# Patient Record
Sex: Female | Born: 1970 | Race: Black or African American | Hispanic: No | Marital: Single | Smoking: Never smoker
Health system: Southern US, Community
[De-identification: ages and names within clinical notes are randomized; demographics above are authoritative.]

## PROBLEM LIST (undated history)

## (undated) DIAGNOSIS — I10 Essential (primary) hypertension: Secondary | ICD-10-CM

## (undated) DIAGNOSIS — J45909 Unspecified asthma, uncomplicated: Secondary | ICD-10-CM

## (undated) DIAGNOSIS — E119 Type 2 diabetes mellitus without complications: Secondary | ICD-10-CM

## (undated) DIAGNOSIS — G8929 Other chronic pain: Secondary | ICD-10-CM

## (undated) DIAGNOSIS — K760 Fatty (change of) liver, not elsewhere classified: Secondary | ICD-10-CM

## (undated) DIAGNOSIS — R109 Unspecified abdominal pain: Secondary | ICD-10-CM

## (undated) HISTORY — PX: ABDOMINAL HYSTERECTOMY: SHX81

---

## 2014-06-15 ENCOUNTER — Emergency Department (HOSPITAL_COMMUNITY): Payer: No Typology Code available for payment source

## 2014-06-15 ENCOUNTER — Encounter (HOSPITAL_COMMUNITY): Payer: Self-pay | Admitting: *Deleted

## 2014-06-15 ENCOUNTER — Emergency Department (HOSPITAL_COMMUNITY)
Admission: EM | Admit: 2014-06-15 | Discharge: 2014-06-15 | Disposition: A | Discharge status: Home / Self Care | Payer: No Typology Code available for payment source | Attending: Emergency Medicine | Admitting: Emergency Medicine

## 2014-06-15 DIAGNOSIS — R935 Abnormal findings on diagnostic imaging of other abdominal regions, including retroperitoneum: Secondary | ICD-10-CM | POA: Diagnosis not present

## 2014-06-15 DIAGNOSIS — N390 Urinary tract infection, site not specified: Secondary | ICD-10-CM | POA: Insufficient documentation

## 2014-06-15 DIAGNOSIS — R109 Unspecified abdominal pain: Secondary | ICD-10-CM | POA: Insufficient documentation

## 2014-06-15 DIAGNOSIS — Z90710 Acquired absence of both cervix and uterus: Secondary | ICD-10-CM | POA: Diagnosis not present

## 2014-06-15 DIAGNOSIS — I10 Essential (primary) hypertension: Secondary | ICD-10-CM | POA: Diagnosis not present

## 2014-06-15 DIAGNOSIS — Z8719 Personal history of other diseases of the digestive system: Secondary | ICD-10-CM | POA: Insufficient documentation

## 2014-06-15 DIAGNOSIS — R9389 Abnormal findings on diagnostic imaging of other specified body structures: Secondary | ICD-10-CM

## 2014-06-15 DIAGNOSIS — G8929 Other chronic pain: Secondary | ICD-10-CM | POA: Diagnosis not present

## 2014-06-15 DIAGNOSIS — J45909 Unspecified asthma, uncomplicated: Secondary | ICD-10-CM | POA: Diagnosis not present

## 2014-06-15 DIAGNOSIS — E119 Type 2 diabetes mellitus without complications: Secondary | ICD-10-CM | POA: Diagnosis not present

## 2014-06-15 DIAGNOSIS — R319 Hematuria, unspecified: Secondary | ICD-10-CM

## 2014-06-15 DIAGNOSIS — Z79899 Other long term (current) drug therapy: Secondary | ICD-10-CM | POA: Insufficient documentation

## 2014-06-15 HISTORY — DX: Essential (primary) hypertension: I10

## 2014-06-15 HISTORY — DX: Other chronic pain: G89.29

## 2014-06-15 HISTORY — DX: Type 2 diabetes mellitus without complications: E11.9

## 2014-06-15 HISTORY — DX: Fatty (change of) liver, not elsewhere classified: K76.0

## 2014-06-15 HISTORY — DX: Unspecified abdominal pain: R10.9

## 2014-06-15 HISTORY — DX: Unspecified asthma, uncomplicated: J45.909

## 2014-06-15 LAB — CBC WITH DIFFERENTIAL/PLATELET
BASOS PCT: 0 % (ref 0–1)
Basophils Absolute: 0 10*3/uL (ref 0.0–0.1)
EOS ABS: 0 10*3/uL (ref 0.0–0.7)
Eosinophils Relative: 0 % (ref 0–5)
HEMATOCRIT: 43.3 % (ref 36.0–46.0)
HEMOGLOBIN: 14.7 g/dL (ref 12.0–15.0)
Lymphocytes Relative: 11 % — ABNORMAL LOW (ref 12–46)
Lymphs Abs: 0.8 10*3/uL (ref 0.7–4.0)
MCH: 26.8 pg (ref 26.0–34.0)
MCHC: 33.9 g/dL (ref 30.0–36.0)
MCV: 79 fL (ref 78.0–100.0)
MONO ABS: 0.3 10*3/uL (ref 0.1–1.0)
MONOS PCT: 4 % (ref 3–12)
NEUTROS ABS: 6.2 10*3/uL (ref 1.7–7.7)
Neutrophils Relative %: 85 % — ABNORMAL HIGH (ref 43–77)
Platelets: 369 10*3/uL (ref 150–400)
RBC: 5.48 MIL/uL — ABNORMAL HIGH (ref 3.87–5.11)
RDW: 14.1 % (ref 11.5–15.5)
WBC: 7.4 10*3/uL (ref 4.0–10.5)

## 2014-06-15 LAB — URINALYSIS, ROUTINE W REFLEX MICROSCOPIC
BILIRUBIN URINE: NEGATIVE
Glucose, UA: >1000 mg/dL — AB
Ketones, ur: 40 mg/dL — AB
LEUKOCYTES UA: NEGATIVE
Nitrite: NEGATIVE
PH: 5.5 (ref 5.0–8.0)
Protein, ur: NEGATIVE mg/dL
SPECIFIC GRAVITY, URINE: 1.02 (ref 1.005–1.030)
Urobilinogen, UA: 0.2 mg/dL (ref 0.0–1.0)

## 2014-06-15 LAB — URINE MICROSCOPIC-ADD ON

## 2014-06-15 LAB — COMPREHENSIVE METABOLIC PANEL
ALBUMIN: 3.6 g/dL (ref 3.5–5.2)
ALK PHOS: 105 U/L (ref 39–117)
ALT: 59 U/L — AB (ref 0–35)
AST: 43 U/L — ABNORMAL HIGH (ref 0–37)
Anion gap: 13 (ref 5–15)
BUN: 8 mg/dL (ref 6–23)
CO2: 24 mEq/L (ref 19–32)
Calcium: 8.8 mg/dL (ref 8.4–10.5)
Chloride: 97 mEq/L (ref 96–112)
Creatinine, Ser: 0.79 mg/dL (ref 0.50–1.10)
GFR calc Af Amer: >90 mL/min (ref >90)
GFR calc non Af Amer: >90 mL/min (ref >90)
Glucose, Bld: 332 mg/dL — ABNORMAL HIGH (ref 70–99)
POTASSIUM: 3.9 meq/L (ref 3.7–5.3)
SODIUM: 134 meq/L — AB (ref 137–147)
TOTAL PROTEIN: 7.3 g/dL (ref 6.0–8.3)
Total Bilirubin: 0.7 mg/dL (ref 0.3–1.2)

## 2014-06-15 LAB — CBG MONITORING, ED: GLUCOSE-CAPILLARY: 273 mg/dL — AB (ref 70–99)

## 2014-06-15 LAB — LIPASE, BLOOD: Lipase: 26 U/L (ref 11–59)

## 2014-06-15 MED ORDER — ACETAMINOPHEN 500 MG PO TABS
1000.0000 mg | ORAL_TABLET | Freq: Once | ORAL | Status: AC
  Administered 2014-06-15: 1000 mg via ORAL
  Filled 2014-06-15: qty 2

## 2014-06-15 MED ORDER — IOHEXOL 300 MG/ML  SOLN
100.0000 mL | Freq: Once | INTRAMUSCULAR | Status: AC | PRN
  Administered 2014-06-15: 100 mL via INTRAVENOUS

## 2014-06-15 MED ORDER — SODIUM CHLORIDE 0.9 % IV SOLN: INTRAVENOUS | Status: DC

## 2014-06-15 MED ORDER — FAMOTIDINE IN NACL 20-0.9 MG/50ML-% IV SOLN
20.0000 mg | Freq: Once | INTRAVENOUS | Status: AC
  Administered 2014-06-15: 20 mg via INTRAVENOUS
  Filled 2014-06-15: qty 50

## 2014-06-15 MED ORDER — SODIUM CHLORIDE 0.9 % IV BOLUS (SEPSIS)
1000.0000 mL | Freq: Once | INTRAVENOUS | Status: AC
  Administered 2014-06-15: 1000 mL via INTRAVENOUS

## 2014-06-15 MED ORDER — ONDANSETRON HCL 4 MG PO TABS: 4.0000 mg | ORAL_TABLET | Freq: Three times a day (TID) | ORAL | Status: DC | PRN

## 2014-06-15 MED ORDER — IOHEXOL 300 MG/ML  SOLN
50.0000 mL | Freq: Once | INTRAMUSCULAR | Status: AC | PRN
  Administered 2014-06-15: 50 mL via ORAL

## 2014-06-15 MED ORDER — TRAMADOL HCL 50 MG PO TABS: 50.0000 mg | ORAL_TABLET | Freq: Four times a day (QID) | ORAL | Status: DC | PRN

## 2014-06-15 MED ORDER — ONDANSETRON HCL 4 MG/2ML IJ SOLN
4.0000 mg | INTRAMUSCULAR | Status: DC | PRN
  Administered 2014-06-15: 4 mg via INTRAVENOUS
  Filled 2014-06-15: qty 2

## 2014-06-15 MED ORDER — CEPHALEXIN 500 MG PO CAPS: 500.0000 mg | ORAL_CAPSULE | Freq: Four times a day (QID) | ORAL | Status: DC

## 2014-06-15 NOTE — ED Provider Notes (Signed)
CSN: [id number]     Arrival date & time 06/15/14  1815 History   First MD Initiated Contact with Patient 06/15/14 1832     Chief Complaint  Patient presents with  . Abdominal Pain     HPI  Pt was seen at 1935.  Per pt, c/o gradual onset and persistence of constant upper abd "pain" for the past several years, worse over the past 2 to 3 months.  Has been associated with multiple intermittent episodes of N/V.  Describes the abd pain as "cramping" and "bloating." Discomfort worsens after eating. States she was told several years ago she needed to have an EGD performed and "have my gallbladder checked out," due to her symptoms, but she never had either completed.  Denies diarrhea, no fevers, no back pain, no rash, no CP/SOB, no black or blood in stools or emesis. Pt also c/o gradual onset and persistence of constant dysuria and hematuria for the past several days. Describes the hematuria as "seeing blood on the toilet paper" when she wipes after urinating. Denies flank pain, no fevers, no vaginal bleeding/discharge. Pt has not taken any OTC meds to treat her symptoms. Pt states she came to the ED today "because they made me" (friends at bedside), not because her symptoms have changed or worsened.        Past Medical History  Diagnosis Date  . Hypertension   . Diabetes mellitus without complication   . Asthma   . Chronic abdominal pain    Past Surgical History  Procedure Laterality Date  . Abdominal hysterectomy      History  Substance Use Topics  . Smoking status: Never Smoker   . Smokeless tobacco: Not on file  . Alcohol Use: Yes   OB History    No data available     Review of Systems ROS: Statement: All systems negative except as marked or noted in the HPI; Constitutional: Negative for fever and chills. ; ; Eyes: Negative for eye pain, redness and discharge. ; ; ENMT: Negative for ear pain, hoarseness, nasal congestion, sinus pressure and sore throat. ; ; Cardiovascular: Negative  for chest pain, palpitations, diaphoresis, dyspnea and peripheral edema. ; ; Respiratory: Negative for cough, wheezing and stridor. ; ; Gastrointestinal: +abd pain, N/V. Negative for diarrhea, blood in stool, hematemesis, jaundice and rectal bleeding. . ; ; Genitourinary: Negative for flank pain. +dysuria and hematuria. ; ; Musculoskeletal: Negative for back pain and neck pain. Negative for swelling and trauma.; ; Skin: Negative for pruritus, rash, abrasions, blisters, bruising and skin lesion.; ; Neuro: Negative for headache, lightheadedness and neck stiffness. Negative for weakness, altered level of consciousness , altered mental status, extremity weakness, paresthesias, involuntary movement, seizure and syncope.      Allergies  Review of patient's allergies indicates no known allergies.  Home Medications   Prior to Admission medications   Medication Sig Start Date End Date Taking? Authorizing Provider  budesonide-formoterol (SYMBICORT) 160-4.5 MCG/ACT inhaler Inhale 2 puffs into the lungs 2 (two) times daily.   Yes Historical Provider, MD  lisinopril-hydrochlorothiazide (PRINZIDE,ZESTORETIC) 10-12.5 MG per tablet Take 1 tablet by mouth daily.   Yes Historical Provider, MD  metFORMIN (GLUCOPHAGE-XR) 500 MG 24 hr tablet Take 500 mg by mouth daily with breakfast.   Yes Historical Provider, MD   BP 164/99 mmHg  Pulse 115  Temp(Src) 100.5 F (38.1 C) (Oral)  Resp 20  Ht 5\' 7"  (1.702 m)  Wt 297 lb (134.718 kg)  BMI 46.51 kg/m2  SpO2 98%  Physical Exam  1840: Physical examination:  Nursing notes reviewed; Vital signs and O2 SAT reviewed;  Constitutional: Well developed, Well nourished, Well hydrated, In no acute distress; Head:  Normocephalic, atraumatic; Eyes: EOMI, PERRL, No scleral icterus; ENMT: Mouth and pharynx normal, Mucous membranes moist; Neck: Supple, Full range of motion, No lymphadenopathy; Cardiovascular: Regular rate and rhythm, No gallop; Respiratory: Breath sounds clear & equal  bilaterally, No wheezes.  Speaking full sentences with ease, Normal respiratory effort/excursion; Chest: Nontender, Movement normal; Abdomen: Soft, Nontender, Nondistended, Normal bowel sounds; Genitourinary: No CVA tenderness; Extremities: Pulses normal, No tenderness, No edema, No calf edema or asymmetry.; Neuro: AA&Ox3, Major CN grossly intact.  Speech clear. No gross focal motor or sensory deficits in extremities. Climbs on and off stretcher easily by herself. Gait steady.; Skin: Color normal, Warm, Dry.   ED Course  Procedures     MDM  MDM Reviewed: nursing note and vitals Interpretation: labs and CT scan     Results for orders placed or performed during the hospital encounter of 06/15/14  Urinalysis, Routine w reflex microscopic  Result Value Ref Range   Color, Urine YELLOW YELLOW   APPearance HAZY (A) CLEAR   Specific Gravity, Urine 1.020 1.005 - 1.030   pH 5.5 5.0 - 8.0   Glucose, UA >1000 (A) NEGATIVE mg/dL   Hgb urine dipstick MODERATE (A) NEGATIVE   Bilirubin Urine NEGATIVE NEGATIVE   Ketones, ur 40 (A) NEGATIVE mg/dL   Protein, ur NEGATIVE NEGATIVE mg/dL   Urobilinogen, UA 0.2 0.0 - 1.0 mg/dL   Nitrite NEGATIVE NEGATIVE   Leukocytes, UA NEGATIVE NEGATIVE  Lipase, blood  Result Value Ref Range   Lipase 26 11 - 59 U/L  Comprehensive metabolic panel  Result Value Ref Range   Sodium 134 (L) 137 - 147 mEq/L   Potassium 3.9 3.7 - 5.3 mEq/L   Chloride 97 96 - 112 mEq/L   CO2 24 19 - 32 mEq/L   Glucose, Bld 332 (H) 70 - 99 mg/dL   BUN 8 6 - 23 mg/dL   Creatinine, Ser 1.610.79 0.50 - 1.10 mg/dL   Calcium 8.8 8.4 - 09.610.5 mg/dL   Total Protein 7.3 6.0 - 8.3 g/dL   Albumin 3.6 3.5 - 5.2 g/dL   AST 43 (H) 0 - 37 U/L   ALT 59 (H) 0 - 35 U/L   Alkaline Phosphatase 105 39 - 117 U/L   Total Bilirubin 0.7 0.3 - 1.2 mg/dL   GFR calc non Af Amer >90 >90 mL/min   GFR calc Af Amer >90 >90 mL/min   Anion gap 13 5 - 15  CBC with Differential  Result Value Ref Range   WBC 7.4 4.0  - 10.5 K/uL   RBC 5.48 (H) 3.87 - 5.11 MIL/uL   Hemoglobin 14.7 12.0 - 15.0 g/dL   HCT 04.543.3 40.936.0 - 81.146.0 %   MCV 79.0 78.0 - 100.0 fL   MCH 26.8 26.0 - 34.0 pg   MCHC 33.9 30.0 - 36.0 g/dL   RDW 91.414.1 78.211.5 - 95.615.5 %   Platelets 369 150 - 400 K/uL   Neutrophils Relative % 85 (H) 43 - 77 %   Neutro Abs 6.2 1.7 - 7.7 K/uL   Lymphocytes Relative 11 (L) 12 - 46 %   Lymphs Abs 0.8 0.7 - 4.0 K/uL   Monocytes Relative 4 3 - 12 %   Monocytes Absolute 0.3 0.1 - 1.0 K/uL   Eosinophils Relative 0 0 - 5 %   Eosinophils  Absolute 0.0 0.0 - 0.7 K/uL   Basophils Relative 0 0 - 1 %   Basophils Absolute 0.0 0.0 - 0.1 K/uL  Urine microscopic-add on  Result Value Ref Range   Squamous Epithelial / LPF MANY (A) RARE   WBC, UA 7-10 <3 WBC/hpf   RBC / HPF 3-6 <3 RBC/hpf   Bacteria, UA RARE RARE   Urine-Other YEAST   CBG monitoring, ED  Result Value Ref Range   Glucose-Capillary 273 (H) 70 - 99 mg/dL   Ct Abdomen Pelvis W Contrast 06/15/2014   CLINICAL DATA:  Abdominal pain.  EXAM: CT ABDOMEN AND PELVIS WITH CONTRAST  TECHNIQUE: Multidetector CT imaging of the abdomen and pelvis was performed using the standard protocol following bolus administration of intravenous contrast.  CONTRAST:  50mL OMNIPAQUE IOHEXOL 300 MG/ML SOLN, OMNIPAQUE IOHEXOL 300 MG/ML SOLN  COMPARISON:  07/09/2011  FINDINGS: BODY WALL: Unremarkable.  LOWER CHEST: Unremarkable.  ABDOMEN/PELVIS:  Liver: Diffuse hepatic steatosis.  Biliary: No evidence of biliary obstruction or stone.  Pancreas: Unremarkable.  Spleen: Unremarkable.  Adrenals: Unremarkable.  Kidneys and ureters: No hydronephrosis or stone.  Bladder: Unremarkable.  Reproductive: Hysterectomy and probable oophorectomies. This is for unknown indication.  Bowel: No obstruction. The base of the appendix is dilated at 8 mm but there is no associated fat stranding. The tip is collapsed. No low-attenuation filling in this mildly distended area suggestive of a mucocele. The appendix  will be visualized on followup imaging.  Retroperitoneum: There is an ill-defined 22 mm diameter mass along the anterior left psoas, just left of the external iliac artery. The gonadal vessels bypass this and although there is extensive contact with the psoas, it does not appear primarily muscular. A small, normal lymph node was noted in this region on the previous study. Haziness of the surrounding fat could reflect an inflammatory process, although no primary inflammation seen within the abdomen.  Peritoneum: No ascites or pneumoperitoneum.  Vascular: No acute abnormality.  OSSEOUS: No acute abnormalities.  These results were called by telephone at the time of interpretation on 06/15/2014 at 9:31 pm to Dr. Samuel Jester , who verbally acknowledged these results.  IMPRESSION: 22 mm mass in the left retroperitoneum, likely adenopathy rather than primary mass. This is primarily concerning for a neoplastic process, although associated fat infiltration could indicate an inflammatory etiology. Recommend correlation with pathology from remote hysterectomy/oophorectomies and consideration of tissue sampling.   Electronically Signed   By: Tiburcio Pea M.D.   On: 06/15/2014 21:37    2155:  T/C to General Surgery Dr. Lovell Sheehan, case discussed, including:  HPI, pertinent PM/SHx, VS/PE, dx testing, ED course and treatment:  Agreeable to see pt in f/u , but he does not participate in her plan and she will incur medical bills if he orders workup for CT finding of retroperitoneal mass; no acute appy at this time; have pt f/u with her PMD to refer to IR for tissue sampling. Pt has tol PO well while in the ED without N/V. No stooling while in the ED. Continues to deny back pain/hip pain, which would be c/w CT finding above. Will tx for UTI. Tx GI symptoms with H2 blocker and f/u with GI MD. Pt states she feels better and wants to go home now. Dx and testing, as well as d/w General Surgeon, d/w pt and friends.  Questions  answered.  Verb understanding, agreeable to d/c home with outpt f/u.    Samuel Jester, DO 06/19/14 1355

## 2014-06-15 NOTE — Discharge Instructions (Signed)
°Emergency Department Resource Guide °1) Find a Doctor and Pay Out of Pocket °Although you won't have to find out who is covered by your insurance plan, it is a good idea to ask around and get recommendations. You will then need to call the office and see if the doctor you have chosen will accept you as a new patient and what types of options they offer for patients who are self-pay. Some doctors offer discounts or will set up payment plans for their patients who do not have insurance, but you will need to ask so you aren't surprised when you get to your appointment. ° °2) Contact Your Local Health Department °Not all health departments have doctors that can see patients for sick visits, but many do, so it is worth a call to see if yours does. If you don't know where your local health department is, you can check in your phone book. The CDC also has a tool to help you locate your state's health department, and many state websites also have listings of all of their local health departments. ° °3) Find a Walk-in Clinic °If your illness is not likely to be very severe or complicated, you may want to try a walk in clinic. These are popping up all over the country in pharmacies, drugstores, and shopping centers. They're usually staffed by nurse practitioners or physician assistants that have been trained to treat common illnesses and complaints. They're usually fairly quick and inexpensive. However, if you have serious medical issues or chronic medical problems, these are probably not your best option. ° °No Primary Care Doctor: °- Call [facility] at  832-8000 - they can help you locate a primary care doctor that  accepts your insurance, provides certain services, etc. °- Physician Referral Service- 1-800-533-3463 ° °Chronic Pain Problems: °Organization         Address  Phone   Notes  °[facility]  (336) 297-2271 Patients need to be referred by their primary care doctor.  ° °Medication  Assistance: °Organization         Address  Phone   Notes  °[place] [facility] [address] °[place], [place] [address] (336) 641-8030 --Must be a resident of [place] °-- Must have NO insurance coverage whatsoever (no Medicaid/ Medicare, etc.) °-- The pt. MUST have a primary care doctor that directs their care regularly and follows them in the community °  °[facility]  (866) 331-1348   °[employer]  (888) 892-1162   ° °Agencies that provide inexpensive medical care: °Organization         Address  Phone   Notes  °[facility]  (336) 832-8035   °[facility]    (336) 832-7272   °[facility] [address] °[place], [place] [address] (336) 832-4777   °[facility] [address], °[place] (336) 271-4999   °[facility]    (336) 373-0678   °[facility]    (336) 272-1050   °[facility] ° [address], [place] Phone:  (336) 832-4444, Fax:  (336) 832-4440 Hours of Operation:  9 am - 6 pm, M-F.  Also accepts Medicaid/Medicare and self-pay.  °[facility] ° [address], [place] Phone: (336) 832-3150, Fax: (336) [phone]. Hours of Operation:  8:30 am - 5:30 pm, M-F.  Also accepts Medicaid and self-pay.  °[facility] [place] [address], [place] Phone: (336) 878-6027   °[facility] [address], [place], [place] (336)723-1848, Ext. 123 Mondays & Thursdays: 7-9 AM.  First 15 patients are seen on a first come, first serve basis. °  ° °Medicaid-accepting [place] Providers: ° °Organization         Address  Phone   Notes  °[facility] [address], [place] (336) 641-2100 Also accepts self-pay patients.  °[facility] [address], [place] ° (336) 856-9996   °[facility] [address], [place]  (336) 288-8857   °[facility] [address] [place] [address], [place] (336) 299-7000   °Veita Bland [address], [place]  ° (336) 373-1557 Only accepts [place] Access Medicaid patients after they have their name applied to their card.  ° °Self-Pay (no insurance) in [place]: ° °Organization         Address  Phone   Notes  °Sickle Cell Patients, [facility] [address], [place] (336) 832-1970   °[facility] [address], [place] (336) 832-4400   °[facility] ° [address], [place] (336) 992-4800   °[facility]/Dr. Osei-Bonsu ° [address] [place] [address], [place] or [address], [place] (336) 841-8500 Phone number for both [place] and [place] locations is the same.  °[facility] [address], [place] (336) 299-0000   °[facility] [address] [place] [address], [place] or [address] (336) 852-7530 °(336) 878-2260   °[facility] [address], [place] (336) 350-1642, phone; (336) 294-5005, fax Sees patients 1st and 3rd Saturday of every month.  Must not qualify for public or private insurance (i.e. Medicaid, Medicare, [place] Health Choice, Veterans' Benefits) • Household income should be no more than 200% of the poverty level •The clinic cannot treat you if you are pregnant or think you are pregnant • Sexually transmitted diseases are not treated at the clinic.  ° ° °Dental Care: °Organization         Address  Phone  Notes  °[place] [facility] [address], [place] (336) 641-6152 Accepts children up to age 21 who are enrolled in Medicaid or [place] Health Choice; pregnant women with a Medicaid card; and children who have applied for Medicaid or [place] Health Choice, but were declined, whose parents can pay a reduced fee at time of service.  °[place]  [facility] [place]  [address], [place] (336) 641-7733 Accepts children up to age 21 who are enrolled in Medicaid or [place] Health Choice; pregnant women with a Medicaid card; and children who have applied for Medicaid or [place] Health Choice, but were declined, whose parents can pay a reduced fee at time of service.  °[facility] ° [address], [place] (336) 641-4533 Patients are seen by appointment only. Walk-ins are not accepted. [facility] will see patients 18 years of age and older. °Monday - Tuesday (8am-5pm) °Most Wednesdays (8:30-5pm) °$30 per visit, cash only  °[facility] ° [address], [place] (336) 641-4533 Patients are seen by appointment only. Walk-ins are not accepted. [facility] will see patients 18 years of age and older. °One   Wednesday Evening (Monthly: Volunteer Based).  $30 per visit, cash only  °[facility]  (919) 537-3737 for adults; Children under age 4, call Graduate Pediatric Dentistry at (919) [phone]. Children aged 4-14, please call (919) [phone] to request a pediatric application. ° Dental services are provided in all areas of dental care including fillings, crowns and bridges, complete and partial dentures, implants, gum treatment, root canals, and extractions. Preventive care is also provided. Treatment is provided to both adults and children. °Patients are selected via a lottery and there is often a waiting list. °  °[facility] [address], °[place] ° (336) 763-8833 [email or web address] °  °[facility] [address], [place], [place] (336)723-1848, Ext. 123 Second and Fourth Thursday of each month, opens at 6:30 AM; Clinic ends at 9 AM.  Patients are seen on a first-come first-served basis, and a limited number are seen during each clinic.  ° °[facility] ° [address], [place], [place] (336) 723-7904    Eligibility Requirements °You must have lived in [place], [place], or [place] counties for at least the last three months. °  You cannot be eligible for state or federal sponsored healthcare insurance, including Veterans Administration, Medicaid, or Medicare. °  You generally cannot be eligible for healthcare insurance through your employer.  °  How to apply: °Eligibility screenings are held every Tuesday and Wednesday afternoon from 1:00 pm until 4:00 pm. You do not need an appointment for the interview!  °[facility] [address], [place], [place] 336-631-2330   °[facility]  336-342-8273   °[facility]  336-703-3100   °[facility]  336-570-6415   ° °Behavioral Health Resources in the Community: °Intensive Outpatient Programs °Organization         Address  Phone  Notes  °[facility] [address], [place], [place] 336-878-6098   °[facility] [address], [place], [place] 336-832-9800   °[facility] [address], [place], [place] ° 336-882-2125   °[facility] [address],  °[place], [place] 1-800-853-5163 or 336-641-4981   °Substance Abuse Resources °Organization         Address  Phone  Notes  °[facility]  336-882-2125   °[facility]  336-784-9470   °[facility]  336-285-9073   °[facility]  336-845-3988   °Residential & Outpatient Substance Abuse Program  1-800-659-3381   °Psychological Services °Organization         Address  Phone  Notes  °[facility]  336- 832-9600   °[facility]  336- 378-7881   °[facility] [address], [place] 1-800-853-5163 or 336-641-4981   ° °Mobile Crisis Teams °Organization         Address  Phone  Notes  °[facility], Mobile Crisis Care Unit  1-877-626-1772   °Assertive °[facility] ° [address]  [place], [place] 336-834-9664   °Sharon DeEsch [address] °[place] 336-554-5454   ° °Self-Help/Support Groups °Organization         Address  Phone             Notes  °[facility] of  - variety of support groups  336- 373-1402 Call for more information  °Narcotics Anonymous (NA), [facility] [address], °[place]  2 meetings at this location  ° °  Residential Treatment Programs Organization         Address  Phone  Notes  [facility] [address],    [place]  [phone]   [facility]  [address], [place] [phone], [place], [place] [phone]   [facility] [address], [place] [phone] Admissions: 8am-3pm M-F  [facility] [address]    [place], [place] 295-284-1324([phone]   [facility] [address], [place], [place] [phone]   [facility] [address]  [place], [place] [phone]   [facility] Intensive Outpatient [address], [place], [place] [phone]   [facility] ([facility] [address]  [place], [place] [phone] or [phone]) [phone]   [facility] ([facility] [address] [place], [place] 323-557-3220(703)[phone] Accepts Medicaid  [facility] [address]  [place] [phone] Substance Abuse/Addiction Treatment   [facility] Organization         Address  Phone  Notes  [facility]  (409)491-1436(888) (907)[phone]   Angie FavaJulie Brannon, PhD [address], [address] [place], [place]   [phone](336) [phone] or [phone](336) [phone]   [facility]   [address] [place], [place] ([phone](336) [phone]   [facility] [address], [place], [place] (302)[phone](336) [phone] Insurance/Medicaid/sponsorship through [facility] [address]                                    [place], [place] [phone]) [phone] Therapy/tele-psych/case    [facility] [address]   [place], [place] [phone](336) [phone]    Dr. Lolly MustacheArfeen  [phone](336) [phone]   [facility]  [facility] 1) [address], [place] 2) [address], [place] 3)  [address], [place] ([phone](336) [phone](336) ([phone]  [phone](336) [phone]   [facility] Child Abuse Hotline 206-396-3097(336) 380-250-8161 or (519)[phone](336) 6828173063 (After Hours)      Eat a bland diet, avoiding greasy, fatty, fried foods, as well as spicy and acidic foods or beverages.  Avoid eating within the hour or 2 before going to bed or laying down.  Also avoid teas, colas, coffee, chocolate, pepermint and spearment.  Take over the counter pepcid, one tablet by mouth twice a day, for the next 2 to 3 weeks.  May also take over the counter maalox/mylanta, as directed on packaging, as needed for discomfort.  Take the prescriptions as directed.  Your CT scan showed an incidental finding of:  "There is an ill-defined 22 mm diameter mass along the anterior left psoas, and although there is extensive contact with the psoas, it does not appear primarily muscular. A small, normal lymph node was noted in this region on the previous study. Haziness of the surrounding fat could reflect an inflammatory process, although no primary inflammation seen within the abdomen.  Likely adenopathy rather than primary mass.  Recommend consideration of tissue sampling."  Call your regular medical doctor tomorrow to schedule a follow up appointment in the next 3 days to help coordinate follow up for this finding. Call the GI doctor tomorrow to schedule a follow up appointment within the next week.  Return to the Emergency Department immediately if worsening.

## 2014-06-15 NOTE — ED Notes (Signed)
abd pain for 2 mos, congestion in throat.  Dysuria, and vag spotting, Has had a hyst.  N/v,

## 2014-10-02 ENCOUNTER — Ambulatory Visit (INDEPENDENT_AMBULATORY_CARE_PROVIDER_SITE_OTHER): Payer: No Typology Code available for payment source | Admitting: Internal Medicine

## 2014-10-04 ENCOUNTER — Encounter (INDEPENDENT_AMBULATORY_CARE_PROVIDER_SITE_OTHER): Payer: Self-pay | Admitting: *Deleted

## 2014-10-04 ENCOUNTER — Encounter (INDEPENDENT_AMBULATORY_CARE_PROVIDER_SITE_OTHER): Payer: Self-pay | Admitting: Internal Medicine

## 2014-10-04 ENCOUNTER — Ambulatory Visit (INDEPENDENT_AMBULATORY_CARE_PROVIDER_SITE_OTHER): Payer: Managed Care, Other (non HMO) | Admitting: Internal Medicine

## 2014-10-04 VITALS — BP 112/72 | HR 76 | Temp 97.8°F | Ht 67.0 in | Wt 265.4 lb

## 2014-10-04 DIAGNOSIS — R1030 Lower abdominal pain, unspecified: Secondary | ICD-10-CM

## 2014-10-04 DIAGNOSIS — E119 Type 2 diabetes mellitus without complications: Secondary | ICD-10-CM | POA: Insufficient documentation

## 2014-10-04 DIAGNOSIS — I10 Essential (primary) hypertension: Secondary | ICD-10-CM | POA: Insufficient documentation

## 2014-10-04 DIAGNOSIS — R1314 Dysphagia, pharyngoesophageal phase: Secondary | ICD-10-CM

## 2014-10-04 DIAGNOSIS — R634 Abnormal weight loss: Secondary | ICD-10-CM

## 2014-10-04 NOTE — Patient Instructions (Signed)
DG esophagram, CT abdomen/pelvis with CM, hepatic function, CBC Further recommendations to follow.

## 2014-10-04 NOTE — Progress Notes (Addendum)
Subjective:    Patient ID: Andrea Schwartz, female    DOB: 07-12-1971, 44 y.o.   MRN: [id number]  HPI  Presents today as a f/u from the ED in November. She tells me she occasionally has nausea and vomiting. When she vomits, it is green in color. She feels like foods are slow to go down.  She will cough the food back up.    She says she has "bad" heart burn.  Appetite is okay. She has lost weight. 32 pounds since November. Weight in November 297. She has nausea and vomiting. Occurs once a week. She has lower abdominal pain and she has epigastric pain when she has heart burn. The lower abdominal pain is constant since November.  She takes Pepcid once a day. She also tells me she saw Dr. Philomena Course (surgeon) in [place] concerning her abnormal CT last week.  Hysterectomy in 2012 in [facility] for adenocarcinoma Grade 1 of the uterus ( Dr. Ginger Carne) (Per [facility] records). No chemo or radiation.  CBC    Component Value Date/Time   WBC 7.4 06/15/2014 1855   RBC 5.48* 06/15/2014 1855   HGB 14.7 06/15/2014 1855   HCT 43.3 06/15/2014 1855   PLT 369 06/15/2014 1855   MCV 79.0 06/15/2014 1855   MCH 26.8 06/15/2014 1855   MCHC 33.9 06/15/2014 1855   RDW 14.1 06/15/2014 1855   LYMPHSABS 0.8 06/15/2014 1855   MONOABS 0.3 06/15/2014 1855   EOSABS 0.0 06/15/2014 1855   BASOSABS 0.0 06/15/2014 1855   CMP Latest Ref Rng 06/15/2014  Glucose 70 - 99 mg/dL 409(W)  BUN 6 - 23 mg/dL 8  Creatinine 1.19 - 1.47 mg/dL 8.29  Sodium 562 - 130 mEq/L 134(L)  Potassium 3.7 - 5.3 mEq/L 3.9  Chloride 96 - 112 mEq/L 97  CO2 19 - 32 mEq/L 24  Calcium 8.4 - 10.5 mg/dL 8.8  Total Protein 6.0 - 8.3 g/dL 7.3  Total Bilirubin 0.3 - 1.2 mg/dL 0.7  Alkaline Phos 39 - 117 U/L 105  AST 0 - 37 U/L 43(H)  ALT 0 - 35 U/L 59(H)           06/15/2014 CT abdomen/pelvis with CM: abdominal pain:   IMPRESSION: 22 mm mass in the left retroperitoneum, likely adenopathy rather than primary mass. This is primarily  concerning for a neoplastic process, although associated fat infiltration could indicate an inflammatory etiology. Recommend correlation with pathology from remote hysterectomy/oophorectomies and consideration of tissue     Review of Systems   Single, no children. Past Medical History  Diagnosis Date  . Hypertension   . Diabetes mellitus without complication   . Asthma   . Chronic abdominal pain   . Hepatic steatosis     Past Surgical History  Procedure Laterality Date  . Abdominal hysterectomy      10/2010 for uterine cancer at [facility]    No Known Allergies  Current Outpatient Prescriptions on File Prior to Visit  Medication Sig Dispense Refill  . budesonide-formoterol (SYMBICORT) 160-4.5 MCG/ACT inhaler Inhale 2 puffs into the lungs 2 (two) times daily.    Marland Kitchen lisinopril-hydrochlorothiazide (PRINZIDE,ZESTORETIC) 10-12.5 MG per tablet Take 1 tablet by mouth daily.    . metFORMIN (GLUCOPHAGE-XR) 500 MG 24 hr tablet Take 500 mg by mouth daily with breakfast.     No current facility-administered medications on file prior to visit.        Objective:   Physical Exam Blood pressure 112/72, pulse 76, temperature 97.8 F (36.6 C),  height 5\' 7"  (1.702 m), weight 265 lb 6.4 oz (120.385 kg).  Alert and oriented. Skin warm and dry. Oral mucosa is moist.   . Sclera anicteric, conjunctivae is pink. Thyroid not enlarged. No cervical lymphadenopathy. Lungs clear. Heart regular rate and rhythm.  Abdomen is soft. Bowel sounds are positive. No hepatomegaly. No abdominal masses felt. No tenderness.  No edema to lower extremities.  Morbidly obese.      Assessment & Plan:  Weight loss, GERD, Dysphagia Am concerned about her weight loss and lower abdominal pain. Hx of uterine cancer. Abnormal CT in November. Plan: repeat CT abdomen/pelvis with CM, DG Esophogram.

## 2014-10-09 ENCOUNTER — Telehealth (INDEPENDENT_AMBULATORY_CARE_PROVIDER_SITE_OTHER): Payer: Self-pay | Admitting: *Deleted

## 2014-10-09 NOTE — Telephone Encounter (Signed)
Left detailed message for patient last Thursday (3/17) in regards to her CT appt for tomorrow and asked for a return call, as of today (3/21) I still haven't heard from patient. I did leave another detailed message today advising patient of CT appt and asked for return call to make sure she got my message and also explained she really need to have blood work done Terri order last week

## 2014-10-10 ENCOUNTER — Encounter (INDEPENDENT_AMBULATORY_CARE_PROVIDER_SITE_OTHER): Payer: Self-pay

## 2014-10-10 ENCOUNTER — Ambulatory Visit (HOSPITAL_COMMUNITY)
Admission: RE | Admit: 2014-10-10 | Discharge: 2014-10-10 | Disposition: A | Discharge status: Home / Self Care | Payer: Managed Care, Other (non HMO) | Source: Ambulatory Visit | Attending: Internal Medicine | Admitting: Internal Medicine

## 2014-10-10 DIAGNOSIS — R634 Abnormal weight loss: Secondary | ICD-10-CM | POA: Diagnosis not present

## 2014-10-10 DIAGNOSIS — R1030 Lower abdominal pain, unspecified: Secondary | ICD-10-CM

## 2014-10-10 DIAGNOSIS — R112 Nausea with vomiting, unspecified: Secondary | ICD-10-CM | POA: Diagnosis not present

## 2014-10-10 DIAGNOSIS — I10 Essential (primary) hypertension: Secondary | ICD-10-CM | POA: Insufficient documentation

## 2014-10-10 DIAGNOSIS — R1314 Dysphagia, pharyngoesophageal phase: Secondary | ICD-10-CM | POA: Diagnosis present

## 2014-10-10 DIAGNOSIS — E119 Type 2 diabetes mellitus without complications: Secondary | ICD-10-CM | POA: Insufficient documentation

## 2014-10-10 DIAGNOSIS — R109 Unspecified abdominal pain: Secondary | ICD-10-CM | POA: Insufficient documentation

## 2014-10-11 ENCOUNTER — Telehealth (INDEPENDENT_AMBULATORY_CARE_PROVIDER_SITE_OTHER): Payer: Self-pay | Admitting: Internal Medicine

## 2014-10-11 ENCOUNTER — Telehealth (INDEPENDENT_AMBULATORY_CARE_PROVIDER_SITE_OTHER): Payer: Self-pay | Admitting: *Deleted

## 2014-10-11 DIAGNOSIS — K219 Gastro-esophageal reflux disease without esophagitis: Secondary | ICD-10-CM

## 2014-10-11 LAB — CBC WITH DIFFERENTIAL/PLATELET
Basophils Absolute: 0 10*3/uL (ref 0.0–0.1)
Basophils Relative: 0 % (ref 0–1)
Eosinophils Absolute: 0.1 10*3/uL (ref 0.0–0.7)
Eosinophils Relative: 1 % (ref 0–5)
HEMATOCRIT: 45.9 % (ref 36.0–46.0)
Hemoglobin: 14.6 g/dL (ref 12.0–15.0)
LYMPHS PCT: 30 % (ref 12–46)
Lymphs Abs: 2.3 10*3/uL (ref 0.7–4.0)
MCH: 26.4 pg (ref 26.0–34.0)
MCHC: 31.8 g/dL (ref 30.0–36.0)
MCV: 83 fL (ref 78.0–100.0)
MPV: 9.6 fL (ref 8.6–12.4)
Monocytes Absolute: 0.7 10*3/uL (ref 0.1–1.0)
Monocytes Relative: 9 % (ref 3–12)
Neutro Abs: 4.6 10*3/uL (ref 1.7–7.7)
Neutrophils Relative %: 60 % (ref 43–77)
Platelets: 382 10*3/uL (ref 150–400)
RBC: 5.53 MIL/uL — ABNORMAL HIGH (ref 3.87–5.11)
RDW: 14.2 % (ref 11.5–15.5)
WBC: 7.7 10*3/uL (ref 4.0–10.5)

## 2014-10-11 LAB — HEPATIC FUNCTION PANEL
ALK PHOS: 85 U/L (ref 39–117)
ALT: 57 U/L — AB (ref 0–35)
AST: 51 U/L — ABNORMAL HIGH (ref 0–37)
Albumin: 4.2 g/dL (ref 3.5–5.2)
BILIRUBIN DIRECT: 0.1 mg/dL (ref 0.0–0.3)
BILIRUBIN INDIRECT: 0.5 mg/dL (ref 0.2–1.2)
Total Bilirubin: 0.6 mg/dL (ref 0.2–1.2)
Total Protein: 7.1 g/dL (ref 6.0–8.3)

## 2014-10-11 MED ORDER — OMEPRAZOLE 20 MG PO CPDR: 20.0000 mg | DELAYED_RELEASE_CAPSULE | Freq: Every day | ORAL | Status: DC

## 2014-10-11 NOTE — Telephone Encounter (Signed)
I have spoken with patient.  Dewayne HatchAnn, EGD/ED

## 2014-10-11 NOTE — Telephone Encounter (Signed)
Rx for Omeprazole sent to her pharmacy 

## 2014-10-11 NOTE — Telephone Encounter (Signed)
Andrea Schwartz would like to talk with Andrea Schwartz if she would please return her call at [phone].

## 2014-10-12 NOTE — Telephone Encounter (Signed)
noted 

## 2014-10-12 NOTE — Telephone Encounter (Signed)
Message left on answering machine 

## 2014-10-16 ENCOUNTER — Encounter (INDEPENDENT_AMBULATORY_CARE_PROVIDER_SITE_OTHER): Payer: Self-pay | Admitting: *Deleted

## 2014-10-16 ENCOUNTER — Other Ambulatory Visit (INDEPENDENT_AMBULATORY_CARE_PROVIDER_SITE_OTHER): Payer: Self-pay | Admitting: *Deleted

## 2014-10-16 DIAGNOSIS — R1310 Dysphagia, unspecified: Secondary | ICD-10-CM

## 2014-10-16 NOTE — Telephone Encounter (Signed)
EGD/ED sch'd 11/15/14, patient aware

## 2014-10-16 NOTE — Telephone Encounter (Signed)
Patient returned Terri's call. Sorry that they are playing phone tag. If she would please try her again.

## 2014-10-16 NOTE — Telephone Encounter (Signed)
I have spoke with patient. We are going to cancel the CT. She is going to follow up with Dr. Philomena CourseWhang, (surgeon) concerning the abnormal Ct.

## 2014-10-16 NOTE — Addendum Note (Signed)
Addended by: Len BlalockSETZER, Ronie Barnhart L on: 10/16/2014 08:48 AM   Modules accepted: Orders

## 2014-10-17 ENCOUNTER — Ambulatory Visit (HOSPITAL_COMMUNITY): Payer: Managed Care, Other (non HMO)

## 2014-11-15 ENCOUNTER — Encounter (HOSPITAL_COMMUNITY): Payer: Self-pay | Admitting: *Deleted

## 2014-11-15 ENCOUNTER — Ambulatory Visit (HOSPITAL_COMMUNITY)
Admission: RE | Admit: 2014-11-15 | Discharge: 2014-11-15 | Disposition: A | Discharge status: Home / Self Care | Payer: Managed Care, Other (non HMO) | Source: Ambulatory Visit | Attending: Internal Medicine | Admitting: Internal Medicine

## 2014-11-15 ENCOUNTER — Encounter (HOSPITAL_COMMUNITY)
Admission: RE | Disposition: A | Discharge status: Home / Self Care | Payer: Self-pay | Source: Ambulatory Visit | Attending: Internal Medicine

## 2014-11-15 DIAGNOSIS — I10 Essential (primary) hypertension: Secondary | ICD-10-CM | POA: Insufficient documentation

## 2014-11-15 DIAGNOSIS — K7581 Nonalcoholic steatohepatitis (NASH): Secondary | ICD-10-CM | POA: Insufficient documentation

## 2014-11-15 DIAGNOSIS — R112 Nausea with vomiting, unspecified: Secondary | ICD-10-CM

## 2014-11-15 DIAGNOSIS — R7989 Other specified abnormal findings of blood chemistry: Secondary | ICD-10-CM | POA: Diagnosis not present

## 2014-11-15 DIAGNOSIS — Z90710 Acquired absence of both cervix and uterus: Secondary | ICD-10-CM | POA: Diagnosis not present

## 2014-11-15 DIAGNOSIS — R1013 Epigastric pain: Secondary | ICD-10-CM

## 2014-11-15 DIAGNOSIS — K2980 Duodenitis without bleeding: Secondary | ICD-10-CM | POA: Insufficient documentation

## 2014-11-15 DIAGNOSIS — E119 Type 2 diabetes mellitus without complications: Secondary | ICD-10-CM | POA: Diagnosis not present

## 2014-11-15 DIAGNOSIS — K219 Gastro-esophageal reflux disease without esophagitis: Secondary | ICD-10-CM

## 2014-11-15 DIAGNOSIS — J45909 Unspecified asthma, uncomplicated: Secondary | ICD-10-CM | POA: Diagnosis not present

## 2014-11-15 DIAGNOSIS — R1310 Dysphagia, unspecified: Secondary | ICD-10-CM

## 2014-11-15 DIAGNOSIS — K210 Gastro-esophageal reflux disease with esophagitis: Secondary | ICD-10-CM | POA: Insufficient documentation

## 2014-11-15 DIAGNOSIS — K2950 Unspecified chronic gastritis without bleeding: Secondary | ICD-10-CM | POA: Diagnosis not present

## 2014-11-15 DIAGNOSIS — R945 Abnormal results of liver function studies: Secondary | ICD-10-CM

## 2014-11-15 DIAGNOSIS — K2990 Gastroduodenitis, unspecified, without bleeding: Secondary | ICD-10-CM | POA: Diagnosis not present

## 2014-11-15 DIAGNOSIS — K449 Diaphragmatic hernia without obstruction or gangrene: Secondary | ICD-10-CM

## 2014-11-15 HISTORY — PX: ESOPHAGOGASTRODUODENOSCOPY: SHX5428

## 2014-11-15 HISTORY — PX: ESOPHAGEAL DILATION: SHX303

## 2014-11-15 LAB — GLUCOSE, CAPILLARY
GLUCOSE-CAPILLARY: 274 mg/dL — AB (ref 70–99)
GLUCOSE-CAPILLARY: 308 mg/dL — AB (ref 70–99)
Glucose-Capillary: 367 mg/dL — ABNORMAL HIGH (ref 70–99)

## 2014-11-15 SURGERY — EGD (ESOPHAGOGASTRODUODENOSCOPY)
Anesthesia: Moderate Sedation

## 2014-11-15 MED ORDER — MIDAZOLAM HCL 5 MG/5ML IJ SOLN
INTRAMUSCULAR | Status: DC | PRN
  Administered 2014-11-15: 2 mg via INTRAVENOUS
  Administered 2014-11-15: 1 mg via INTRAVENOUS
  Administered 2014-11-15: 2 mg via INTRAVENOUS

## 2014-11-15 MED ORDER — BUTAMBEN-TETRACAINE-BENZOCAINE 2-2-14 % EX AERO
INHALATION_SPRAY | CUTANEOUS | Status: DC | PRN
  Administered 2014-11-15: 2 via TOPICAL

## 2014-11-15 MED ORDER — INSULIN ASPART 100 UNIT/ML ~~LOC~~ SOLN
5.0000 [IU] | Freq: Once | SUBCUTANEOUS | Status: AC
  Administered 2014-11-15: 5 [IU] via SUBCUTANEOUS
  Filled 2014-11-15: qty 0.05

## 2014-11-15 MED ORDER — MEPERIDINE HCL 50 MG/ML IJ SOLN
INTRAMUSCULAR | Status: DC | PRN
  Administered 2014-11-15 (×2): 25 mg

## 2014-11-15 MED ORDER — MIDAZOLAM HCL 5 MG/5ML IJ SOLN
INTRAMUSCULAR | Status: AC
  Filled 2014-11-15: qty 10

## 2014-11-15 MED ORDER — SODIUM CHLORIDE 0.9 % IV SOLN
INTRAVENOUS | Status: DC
  Administered 2014-11-15: 1000 mL via INTRAVENOUS

## 2014-11-15 MED ORDER — OMEPRAZOLE 20 MG PO CPDR: 20.0000 mg | DELAYED_RELEASE_CAPSULE | Freq: Two times a day (BID) | ORAL | Status: AC

## 2014-11-15 MED ORDER — MEPERIDINE HCL 50 MG/ML IJ SOLN
INTRAMUSCULAR | Status: AC
  Filled 2014-11-15: qty 1

## 2014-11-15 MED ORDER — STERILE WATER FOR IRRIGATION IR SOLN
Status: DC | PRN
  Administered 2014-11-15: 14:00:00

## 2014-11-15 NOTE — H&P (Addendum)
Andrea Schwartz is an 44 y.o. female.   Chief Complaint: Patient is here for EGD. HPI: Patient is 44 year old African female who presented over 6 month history of nausea vomiting and epigastric pain and bloating. Over the last few weeks she has felt better. She has not vomiting although she's been nauseated. She denies hematemesis melena or rectal bleeding. She doesn't have good appetite. She has lost 50 pounds since her symptoms began. She had abdominopelvic CT November last year when she was in emergency room and is a question of enlarged lymph node in left retroperitoneum. Lab studies been pertinent for mildly elevated transaminases felt to be secondary to fatty liver. On her recent visit to our office she was complaining of dysphagia and underwent barium study on 10/10/2014 and was within normal limits. Barium pill passed from oral cavity stomach without any delay. She says she is not having swallowing difficulty anymore. She is having heartburn 3-4 times a week despite taking omeprazole. He also complains of crampy lower abdominal pain and intermittent constipation. She does not cigarettes and drinks alcohol occasionally. She takes OTC NSAIDs sometimes but not frequently. She was diagnosed with diabetes mellitus this year.  Past Medical History  Diagnosis Date  . Hypertension   . Diabetes mellitus without complication   . Asthma   . Chronic abdominal pain   . Hepatic steatosis     Past Surgical History  Procedure Laterality Date  . Abdominal hysterectomy      [facility]    Family History  Problem Relation Age of Onset  . Hypertension Mother   . Anemia Sister    Social History:  reports that she has never smoked. She does not have any smokeless tobacco history on file. She reports that she drinks alcohol. She reports that she does not use illicit drugs.  Allergies: No Known Allergies  Medications Prior to Admission  Medication Sig Dispense Refill  .  budesonide-formoterol (SYMBICORT) 160-4.5 MCG/ACT inhaler Inhale 2 puffs into the lungs 2 (two) times daily.    Marland Kitchen. lisinopril-hydrochlorothiazide (PRINZIDE,ZESTORETIC) 10-12.5 MG per tablet Take 1 tablet by mouth daily.    . metFORMIN (GLUCOPHAGE-XR) 500 MG 24 hr tablet Take 500 mg by mouth daily with breakfast.    . omeprazole (PRILOSEC) 20 MG capsule Take 1 capsule (20 mg total) by mouth daily. 30 capsule 2    Results for orders placed or performed during the hospital encounter of 11/15/14 (from the past 48 hour(s))  Glucose, capillary     Status: Abnormal   Collection Time: 11/15/14 12:51 PM  Result Value Ref Range   Glucose-Capillary 367 (H) 70 - 99 mg/dL   Comment 1 Call MD NNP PA CNM    No results found.  ROS  Blood pressure 135/102, pulse 89, temperature 97.4 F (36.3 C), temperature source Oral, resp. rate 21, height 5\' 7"  (1.702 m), weight 266 lb (120.657 kg), SpO2 97 %. Physical Exam  Constitutional:  Pleasant well-developed obese African-American female in NAD.  HENT:  Mouth/Throat: Oropharynx is clear and moist.  Eyes: Conjunctivae are normal. No scleral icterus.  Neck: No thyromegaly present.  Cardiovascular: Normal rate, regular rhythm and normal heart sounds.   No murmur heard. Respiratory: Effort normal and breath sounds normal.  GI: Soft. She exhibits no distension and no mass. There is no tenderness.  Musculoskeletal: She exhibits no edema.  Lymphadenopathy:    She has no cervical adenopathy.  Neurological: She is alert.  Skin: Skin is warm and dry.  Assessment/Plan Nausea vomiting and epigastric pain and weight loss. Diagnostic EGD.  Curren Mohrmann U 11/15/2014, 1:57 PM

## 2014-11-15 NOTE — Op Note (Signed)
EGD PROCEDURE REPORT  PATIENT:  Andrea Schwartz  MR#:  [id number] Birthdate:  1971-02-22, 44 y.o., female Endoscopist:  Dr. Malissa HippoNajeeb U. Rosaleigh Brazzel, MD  Procedure Date: 11/15/2014  Procedure:   EGD  Indications:  Patient is 44 year old African-American female presents with over 6 month history of intermittent epigastric pain and bloating nausea and vomiting. She also has lost 50 pounds. She has mildly elevated transaminases felt to be due to fatty liver. She also had abdominopelvic CT November last year which did not reveal source of her symptoms. She is having heartburn at least 3-4 times a week in spite of taking omeprazole 20 mg daily. She is undergoing diagnostic EGD.            Informed Consent:  The risks, benefits, alternatives & imponderables which include, but are not limited to, bleeding, infection, perforation, drug reaction and potential missed lesion have been reviewed.  The potential for biopsy, lesion removal, esophageal dilation, etc. have also been discussed.  Questions have been answered.  All parties agreeable.  Please see history & physical in medical record for more information.  Medications:  Demerol 50 mg IV Versed 5 mg IV Cetacaine spray topically for oropharyngeal anesthesia  Description of procedure:  The endoscope was introduced through the mouth and advanced to the second portion of the duodenum without difficulty or limitations. The mucosal surfaces were surveyed very carefully during advancement of the scope and upon withdrawal.  Findings:  Esophagus:  Mucosa of the esophagus was normal. Single erosion noted at GE junction along with focal edema and erythema. No ring or stricture was present. Small sliding hiatal hernia noted. Stomach:  There was small amount of bile in the stomach but no food debris. Distended very well with insufflation. Single erosion noted at gastric body along with 3 antral scars as well as antral erythema and edema. Pyloric channel was patent. Annular  is fundus and cardia were unremarkable. Duodenum:  There was patchy edema and erythema to bulbar mucosa but no erosions or ulcers noted. Post bulbar mucosa was normal.  Therapeutic/Diagnostic Maneuvers Performed:  None  Complications:  None  Impression: Small sliding hiatal hernia with changes of reflux esophagitis limited to GE junction. Gastroduodenitis with three antral scars but no evidence of active ulceration.  Recommendations:  Anti-reflux measures reinforced. Resume omeprazole 20 mg by mouth twice a day. H. pylori serology. Hepatitis B surface antigen and HCV antibody.  Zarina Pe U  11/15/2014  2:27 PM  CC: Dr. Oneita HurtPcp Not In System & Dr. No ref. provider found

## 2014-11-15 NOTE — Progress Notes (Signed)
Dr. Karilyn Cotaehman aware of patient blood glucose levels post-procedures. Orders received and completed. Per Dr.  Karilyn Cotaehman patient needs to follow up with Primary Care Physician 11/16/2014 regarding blood sugar control.  Appointment scheduled and patient aware of need to discuss treatment with PCP.

## 2014-11-15 NOTE — Discharge Instructions (Signed)
Increase omeprazole to 20 mg by mouth 30 minutes before breakfast and evening meal daily Resume other medications and diet as before. Upper abdominal ultrasound to be scheduled. Physician will call with results of blood tests and ultrasound when completed.   APPOINTMENT FOR ABDOMINAL ULTRASOUND Tuesday MAY 3RD, 2015. Marland Kitchen. ARRIVE AT 9:00 AM NOTHING TO EAT OR DRINK AFTER MIDNIGHT ON MONDAY NIGHT.   Esophagogastroduodenoscopy Care After Refer to this sheet in the next few weeks. These instructions provide you with information on caring for yourself after your procedure. Your caregiver may also give you more specific instructions. Your treatment has been planned according to current medical practices, but problems sometimes occur. Call your caregiver if you have any problems or questions after your procedure.  HOME CARE INSTRUCTIONS  Do not eat or drink anything until the numbing medicine (local anesthetic) has worn off and your gag reflex has returned. You will know that the local anesthetic has worn off when you can swallow comfortably.  Do not drive for 12 hours after the procedure or as directed by your caregiver.  Only take medicines as directed by your caregiver. SEEK MEDICAL CARE IF:   You cannot stop coughing.  You are not urinating at all or less than usual. SEEK IMMEDIATE MEDICAL CARE IF:  You have difficulty swallowing.  You cannot eat or drink.  You have worsening throat or chest pain.  You have dizziness, lightheadedness, or you faint.  You have nausea or vomiting.  You have chills.  You have a fever.  You have severe abdominal pain.  You have black, tarry, or bloody stools. Document Released: 06/23/2012 Document Reviewed: 06/23/2012 [facility] Patient Information 2015 [employer], [place]. This information is not intended to replace advice given to you by your health care provider. Make sure you discuss any questions you have with your health care provider.   PATIENT  WITH FOLLOW-UP APPOINTMENT 11-16-2014 AT 11:00 AM TO SEE PCP REGARDING BLOOD SUGARS.

## 2014-11-16 ENCOUNTER — Encounter (HOSPITAL_COMMUNITY): Payer: Self-pay | Admitting: Internal Medicine

## 2014-11-16 LAB — HEPATITIS B SURFACE ANTIGEN: HEP B S AG: NEGATIVE

## 2014-11-16 LAB — HEPATITIS C ANTIBODY: HCV AB: NEGATIVE

## 2014-11-17 ENCOUNTER — Other Ambulatory Visit (HOSPITAL_COMMUNITY): Payer: Managed Care, Other (non HMO)

## 2014-11-21 ENCOUNTER — Ambulatory Visit (HOSPITAL_COMMUNITY)
Admit: 2014-11-21 | Discharge: 2014-11-21 | Disposition: A | Discharge status: Home / Self Care | Payer: Managed Care, Other (non HMO) | Source: Ambulatory Visit | Attending: Internal Medicine | Admitting: Internal Medicine

## 2014-11-21 DIAGNOSIS — R945 Abnormal results of liver function studies: Secondary | ICD-10-CM

## 2014-11-21 DIAGNOSIS — K838 Other specified diseases of biliary tract: Secondary | ICD-10-CM | POA: Insufficient documentation

## 2014-11-21 DIAGNOSIS — R1013 Epigastric pain: Secondary | ICD-10-CM

## 2014-11-21 DIAGNOSIS — R112 Nausea with vomiting, unspecified: Secondary | ICD-10-CM | POA: Diagnosis present

## 2014-11-21 DIAGNOSIS — R7989 Other specified abnormal findings of blood chemistry: Secondary | ICD-10-CM

## 2014-11-22 LAB — H. PYLORI ANTIBODY, IGG

## 2015-06-10 IMAGING — CT CT ABD-PELV W/ CM
2 of 4 series · 16 of 46 positions shown, 18 images · IV contrast (Omnipaque 300)
Comparison: 07/09/2011

CLINICAL DATA: Abdominal pain.

EXAM:
CT ABDOMEN AND PELVIS WITH CONTRAST
TECHNIQUE: Multidetector CT imaging of the abdomen and pelvis was performed
using the standard protocol following bolus administration of
intravenous contrast.
CONTRAST:  50mL OMNIPAQUE IOHEXOL 300 MG/ML SOLN, 100mL OMNIPAQUE
IOHEXOL 300 MG/ML SOLN

[Series 2: abd_pel_with 5.0 b40f · axial · 0.81mm/px · z∈[-602,-207]mm · 13 of 89 slices shown, 15 images]
[im 5/89  soft-tissue]
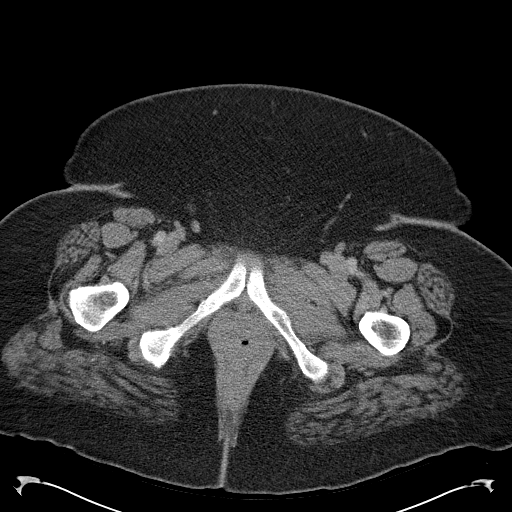
[im 5/89  bone]
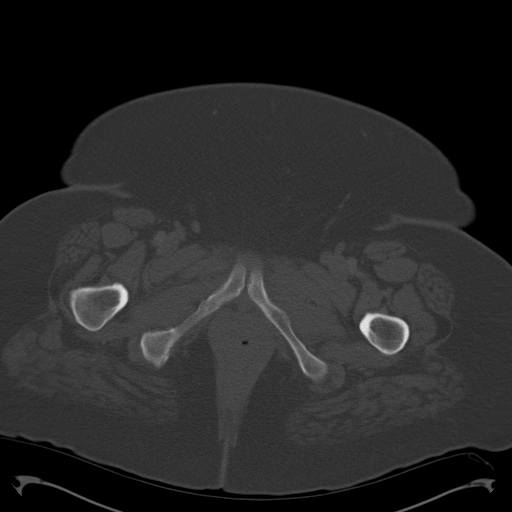
[im 14/89  soft-tissue]
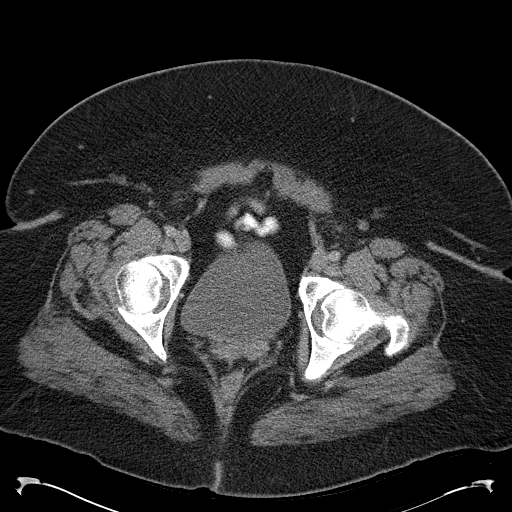
[im 18/89  soft-tissue]
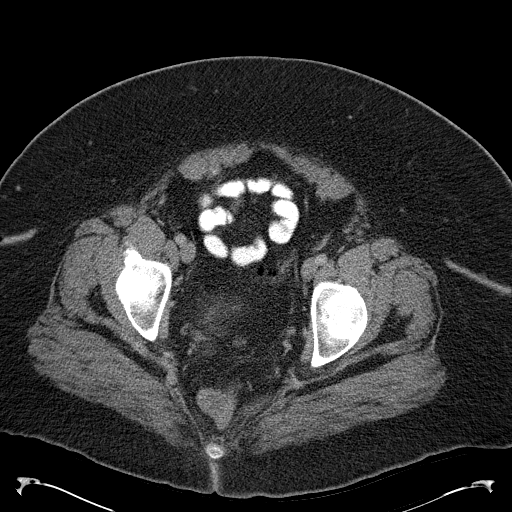
[im 27/89  soft-tissue]
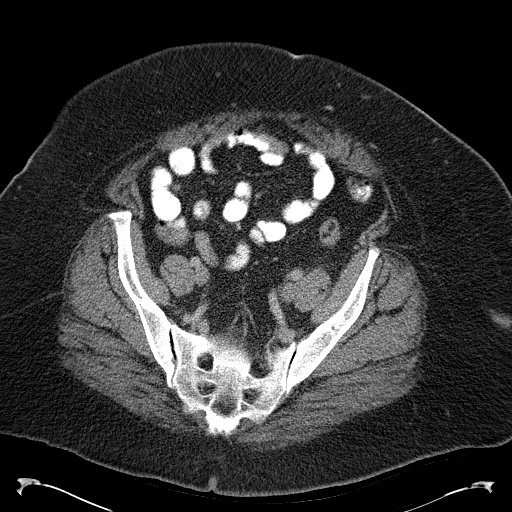
[im 31/89  soft-tissue]
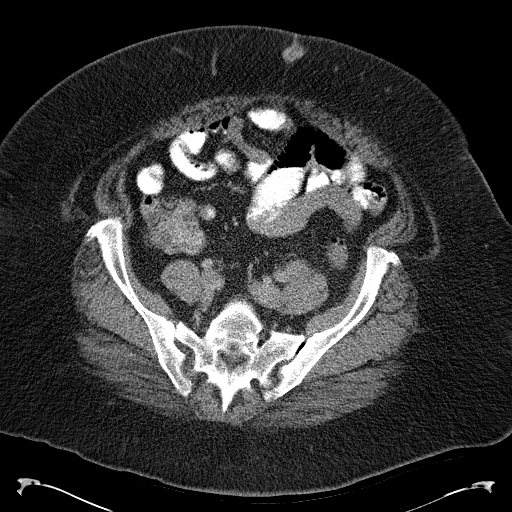
[im 40/89  soft-tissue]
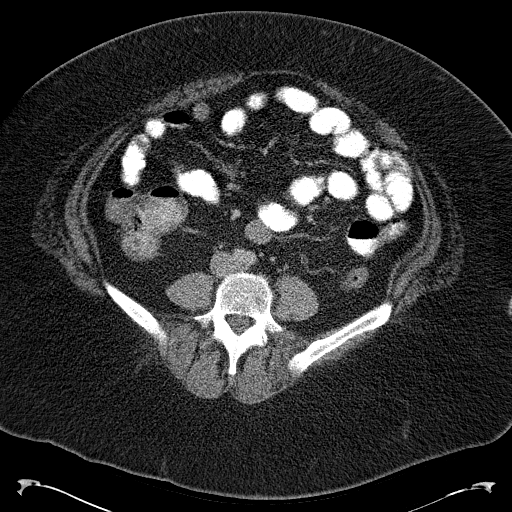
[im 45/89  soft-tissue]
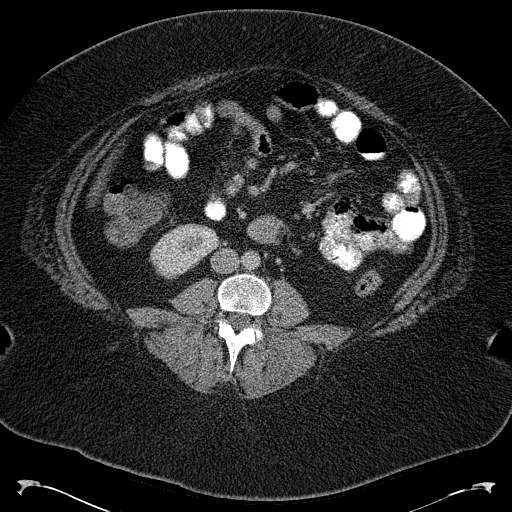
[im 49/89  soft-tissue]
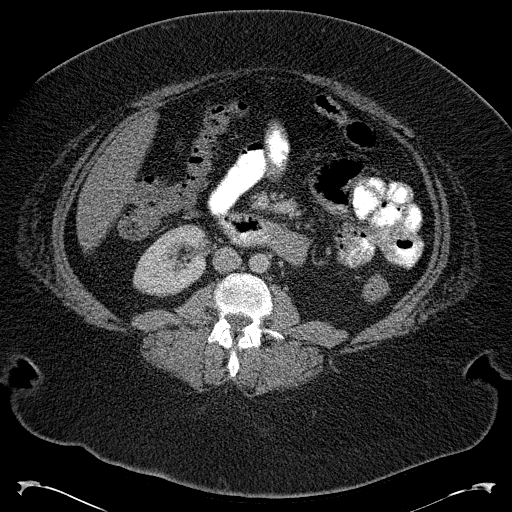
[im 58/89  soft-tissue]
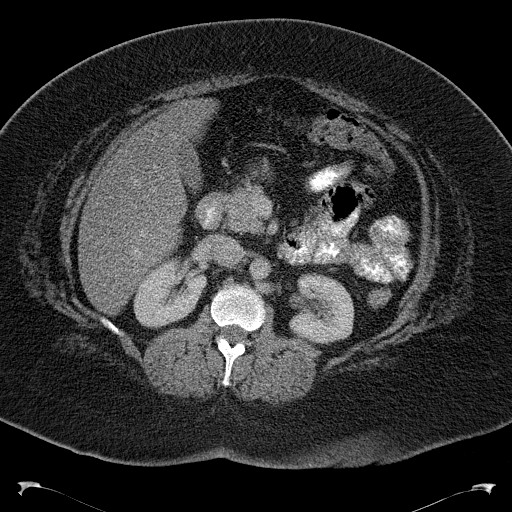
[im 58/89  bone]
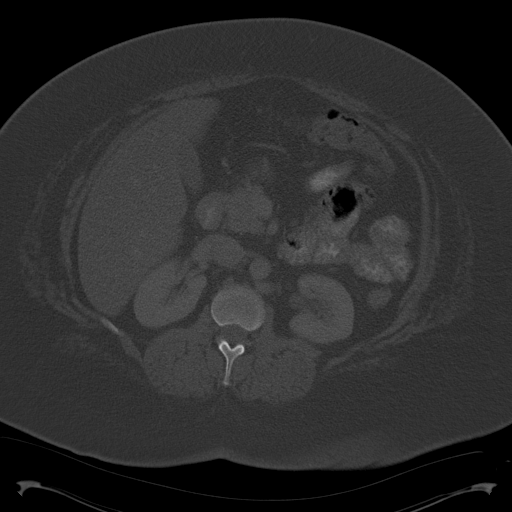
[im 62/89  soft-tissue]
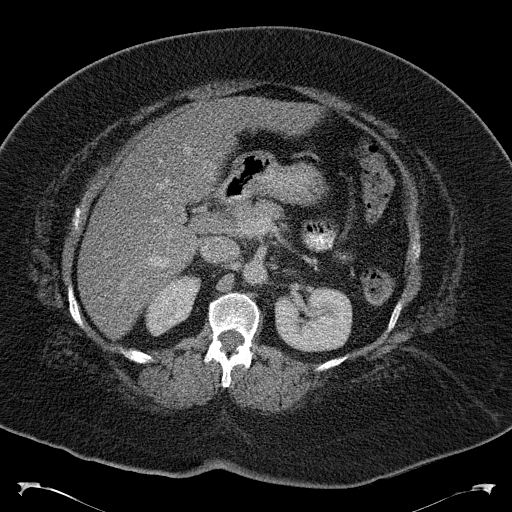
[im 71/89  soft-tissue]
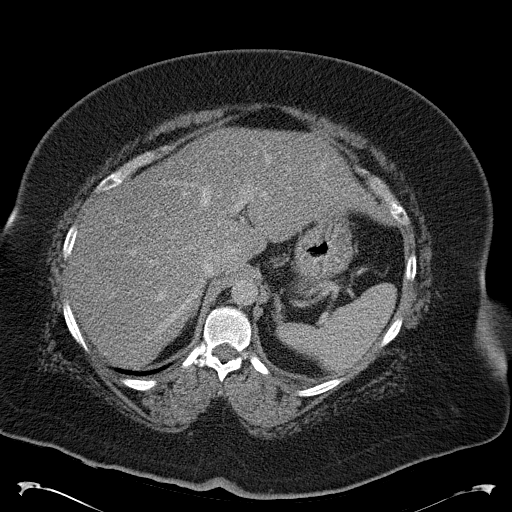
[im 75/89  soft-tissue]
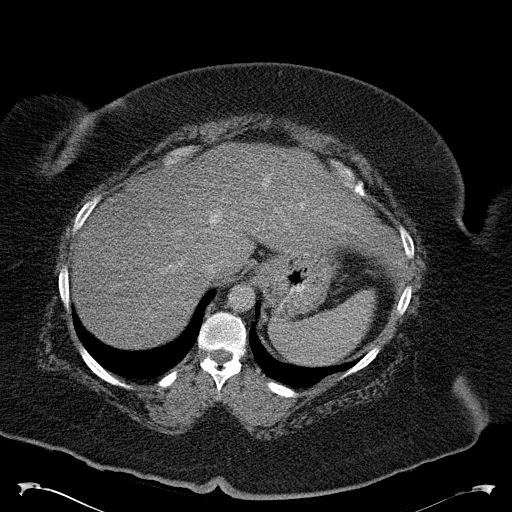
[im 84/89  soft-tissue]
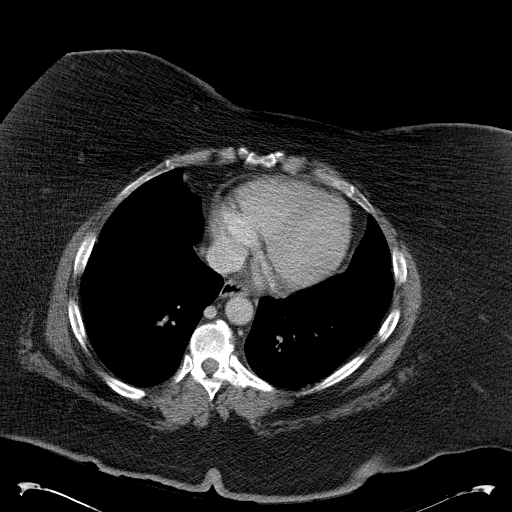

[Series 3: abd_pel_with 3.0 spo cor · coronal · 0.84mm/px · 3 of 131 slices shown]
[im 44/131  soft-tissue]
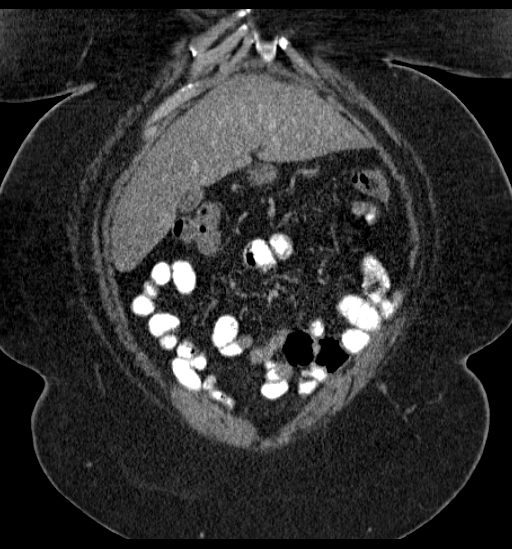
[im 58/131  soft-tissue]
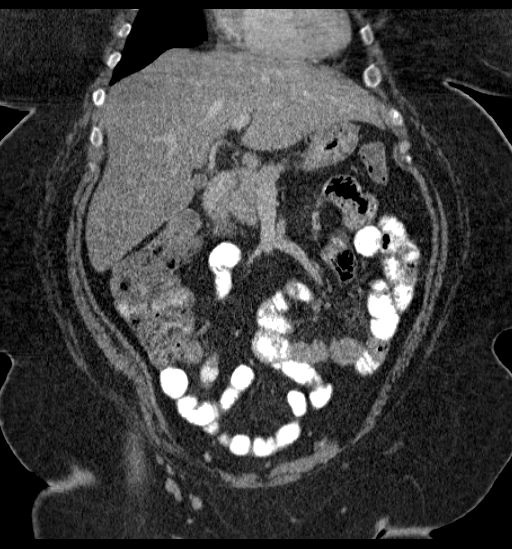
[im 73/131  soft-tissue]
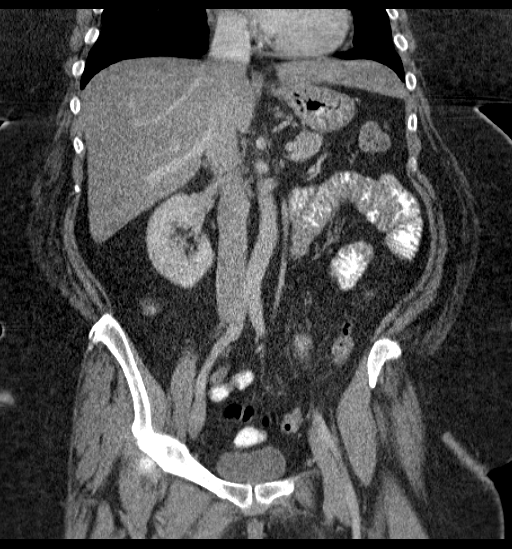

[16 of 46 positions shown; findings below may reference images not displayed]

FINDINGS: BODY WALL: Unremarkable.

LOWER CHEST: Unremarkable.

ABDOMEN/PELVIS:

Liver: Diffuse hepatic steatosis.

Biliary: No evidence of biliary obstruction or stone.

Pancreas: Unremarkable.

Spleen: Unremarkable.

Adrenals: Unremarkable.

Kidneys and ureters: No hydronephrosis or stone.

Bladder: Unremarkable.

Reproductive: Hysterectomy and probable oophorectomies. This is for
unknown indication.

Bowel: No obstruction. The base of the appendix is dilated at 8 mm
but there is no associated fat stranding. The tip is collapsed. No
low-attenuation filling in this mildly distended area suggestive of
a mucocele. The appendix will be visualized on followup imaging.

Retroperitoneum: There is an ill-defined 22 mm diameter mass along
the anterior left psoas, just left of the external iliac artery. The
gonadal vessels bypass this and although there is extensive contact
with the psoas, it does not appear primarily muscular. A small,
normal lymph node was noted in this region on the previous study.
Haziness of the surrounding fat could reflect an inflammatory
process, although no primary inflammation seen within the abdomen.

Peritoneum: No ascites or pneumoperitoneum.

Vascular: No acute abnormality.

OSSEOUS: No acute abnormalities.

These results were called by telephone at the time of interpretation
on 06/15/2014 at [DATE] to Dr. KLYVEN MULOK , who verbally
acknowledged these results.
IMPRESSION: 22 mm mass in the left retroperitoneum, likely adenopathy rather
than primary mass. This is primarily concerning for a neoplastic
process, although associated fat infiltration could indicate an
inflammatory etiology. Recommend correlation with pathology from
remote hysterectomy/oophorectomies and consideration of tissue
sampling.

## 2015-11-16 IMAGING — US US ABDOMEN COMPLETE
1 series · 14 of 25 positions shown · non-contrast
Comparison: CT 06/15/2014 ; abdominal ultrasound 07/09/2011.

CLINICAL DATA: Patient with nausea, vomiting and epigastric pain.

EXAM:
ULTRASOUND ABDOMEN COMPLETE

[Series 1: us abdomen complete · 0.18mm/px · 14 of 121 slices shown]
[im 1/121]
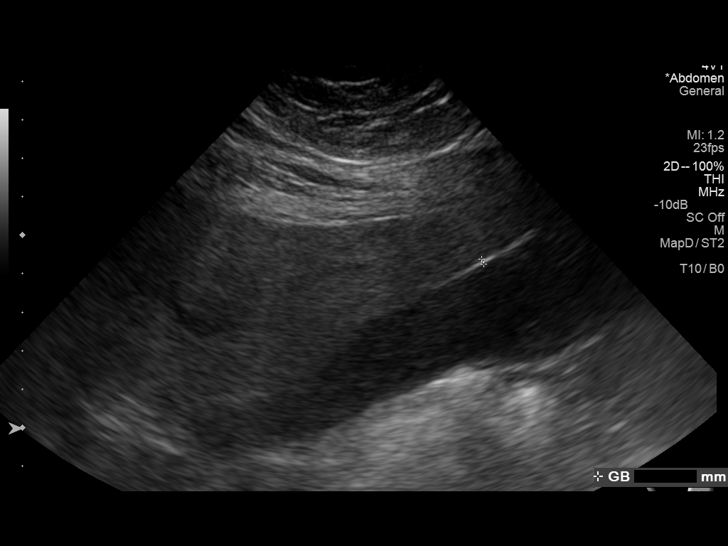
[im 11/121]
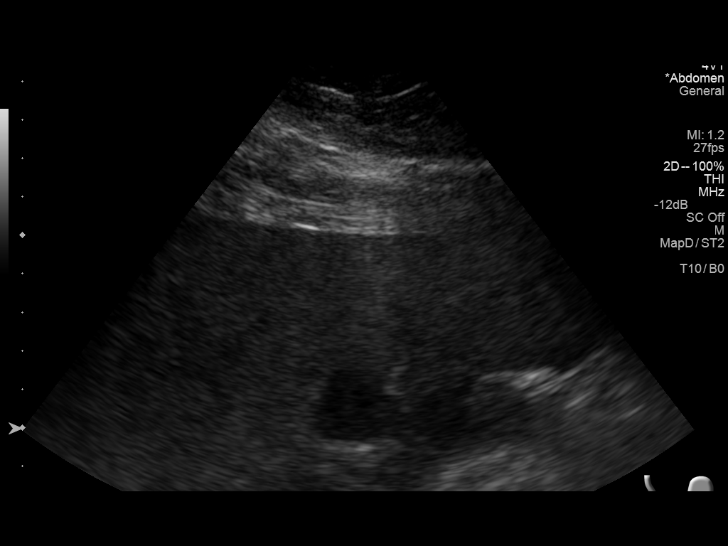
[im 21/121]
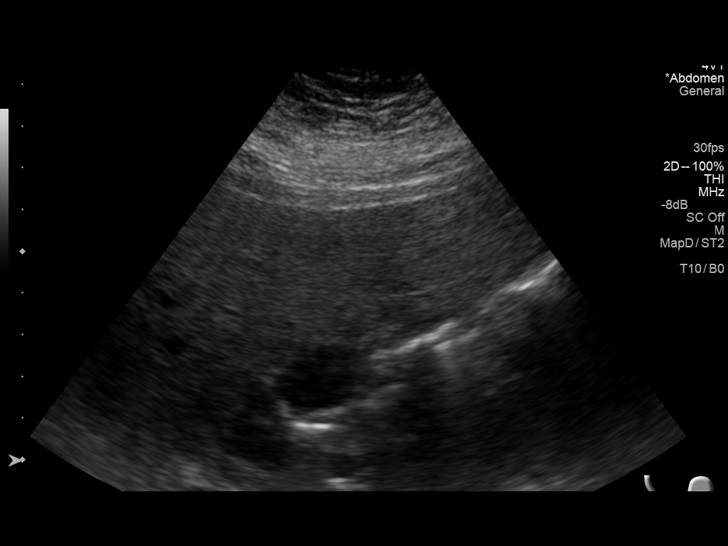
[im 31/121]
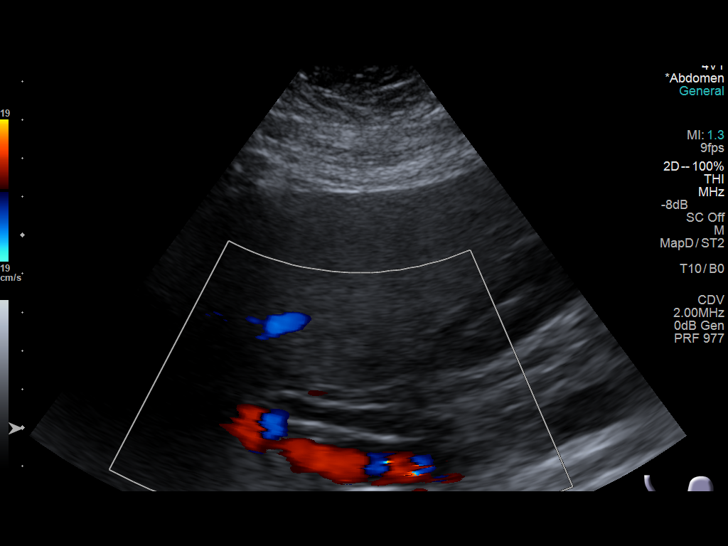
[im 41/121]
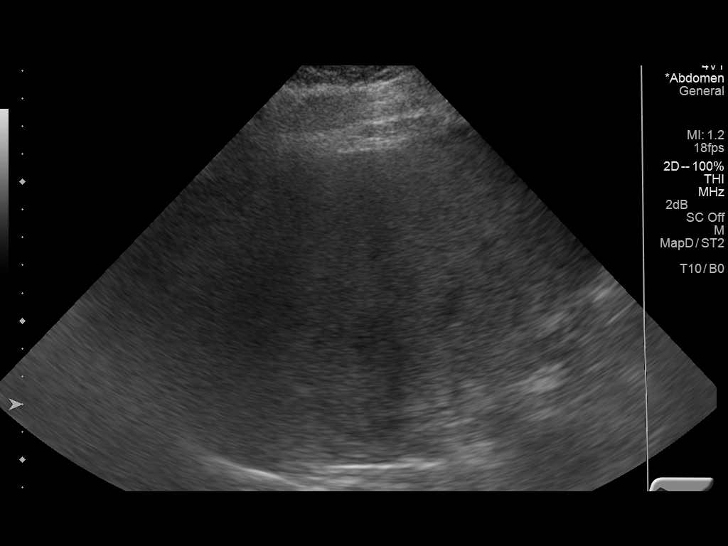
[im 46/121]
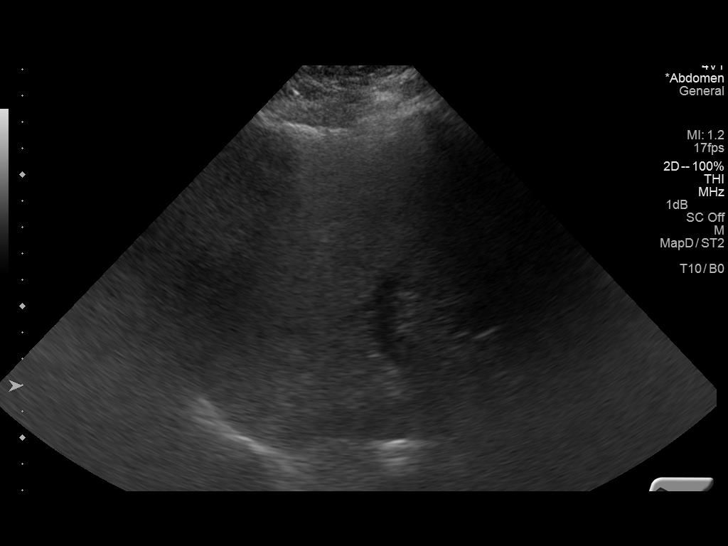
[im 56/121]
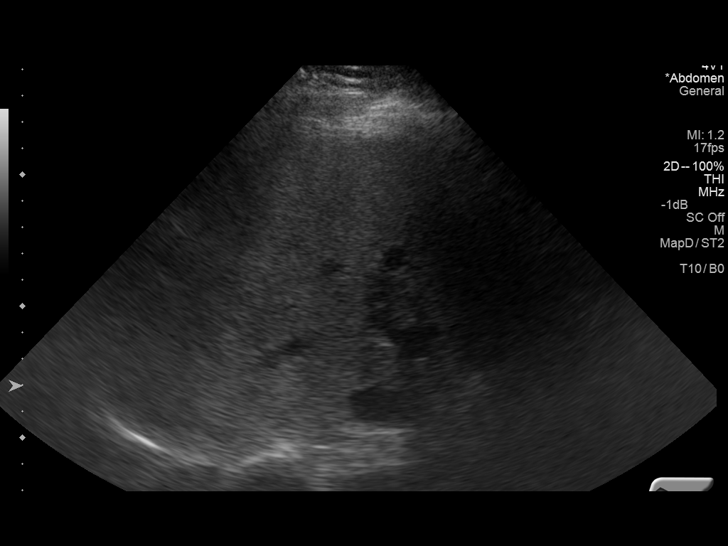
[im 66/121]
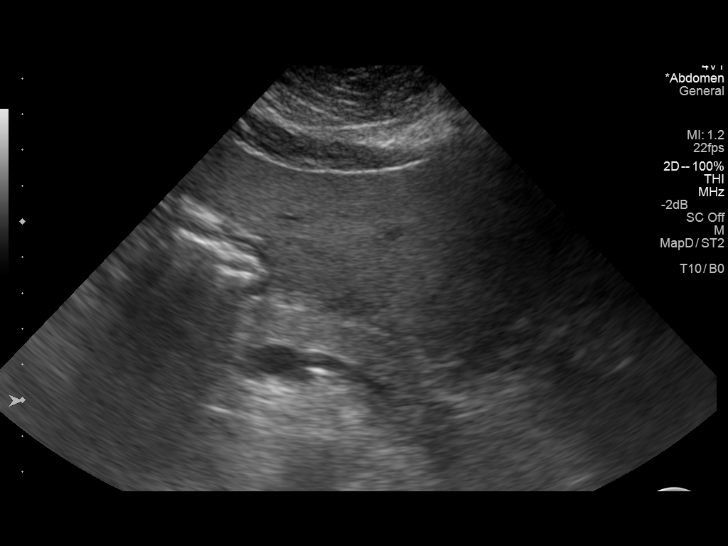
[im 76/121]
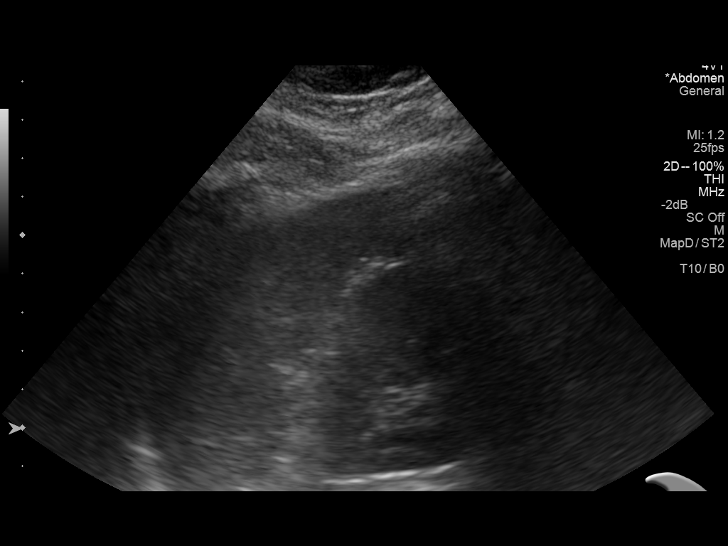
[im 81/121]
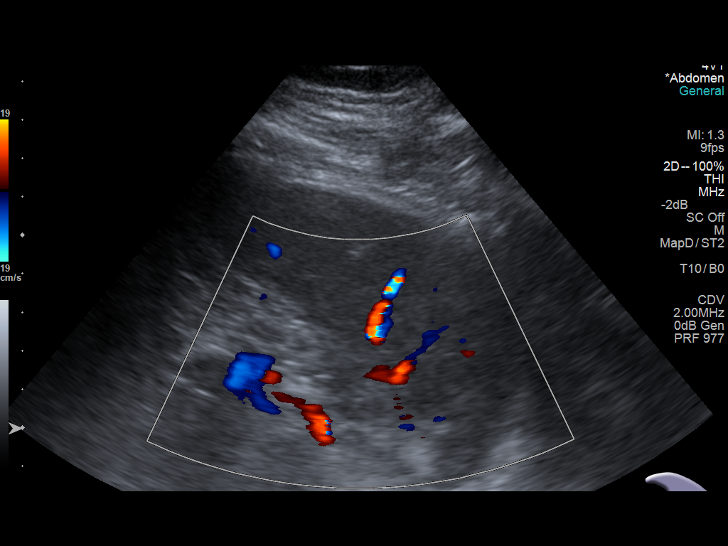
[im 91/121]
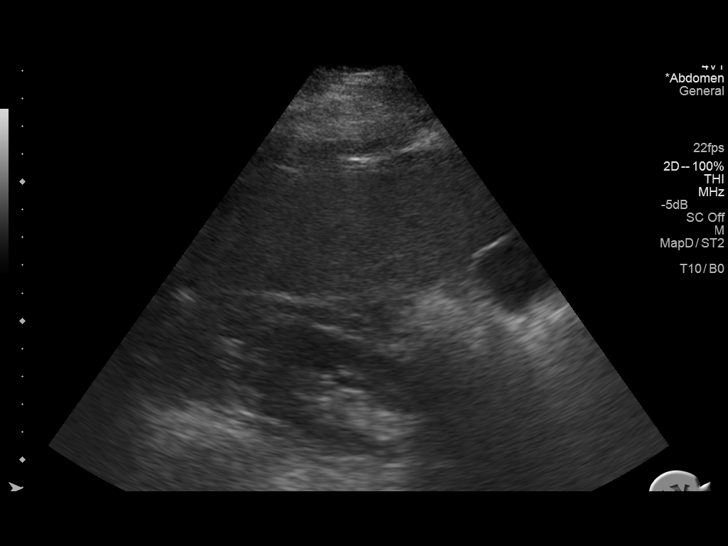
[im 101/121]
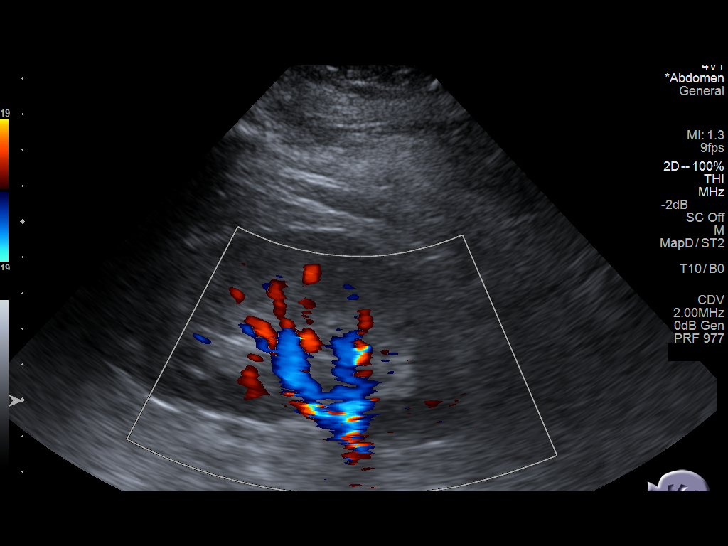
[im 111/121]
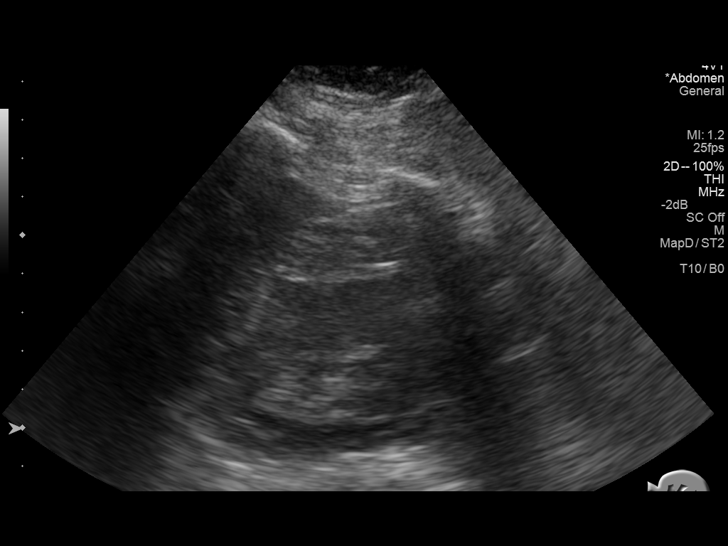
[im 121/121]
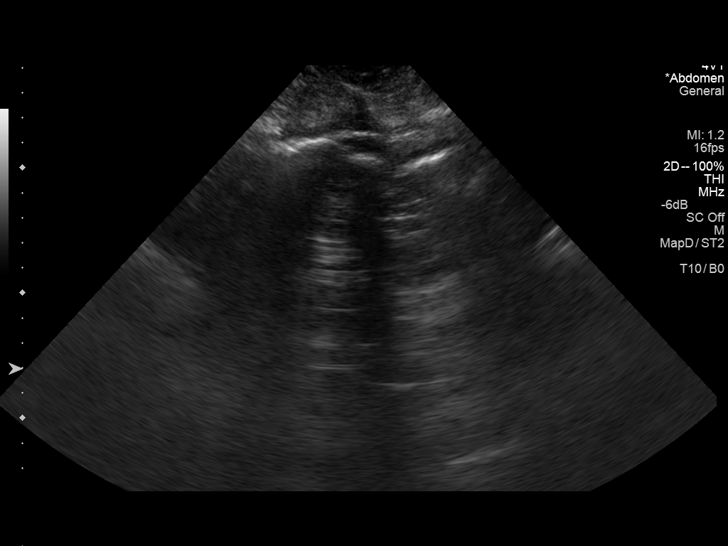

[14 of 25 positions shown; findings below may reference images not displayed]

FINDINGS: Gallbladder: No gallstones or wall thickening visualized. No
sonographic Murphy sign noted.

Common bile duct: Diameter: 7 mm

Liver: Increased in echogenicity.  No focal lesion identified.

IVC: No abnormality visualized.

Pancreas: Not well visualized due to overlying bowel gas.

Spleen: Size and appearance within normal limits.

Right Kidney: Length: 13.5 cm. Echogenicity within normal limits. No
mass or hydronephrosis visualized.

Left Kidney: Length: 12.0 cm. Echogenicity within normal limits. No
mass or hydronephrosis visualized.

Abdominal aorta: Not well visualized due to overlying bowel gas.

Other findings: None.
IMPRESSION: Common bile duct is dilated measuring 7 mm. Recommend correlation
with LFTs. If abnormal LFTs, recommend correlation with MRCP.

Diffusely increased hepatic parenchymal echogenicity suggestive of
steatosis.
